# Patient Record
Sex: Male | Born: 1975 | Race: Black or African American | Hispanic: No | Marital: Married | Smoking: Never smoker
Health system: Southern US, Community
[De-identification: ages and names within clinical notes are randomized; demographics above are authoritative.]

## PROBLEM LIST (undated history)

## (undated) HISTORY — PX: FINGER SURGERY: SHX640

---

## 2017-04-09 ENCOUNTER — Emergency Department (HOSPITAL_COMMUNITY)
Admission: EM | Admit: 2017-04-09 | Discharge: 2017-04-10 | Disposition: A | Discharge status: Home / Self Care | Payer: Self-pay | Attending: Dermatology | Admitting: Dermatology

## 2017-04-09 ENCOUNTER — Encounter (HOSPITAL_COMMUNITY): Payer: Self-pay | Admitting: Emergency Medicine

## 2017-04-09 DIAGNOSIS — Z0283 Encounter for blood-alcohol and blood-drug test: Secondary | ICD-10-CM | POA: Insufficient documentation

## 2017-04-09 DIAGNOSIS — Z5321 Procedure and treatment not carried out due to patient leaving prior to being seen by health care provider: Secondary | ICD-10-CM | POA: Insufficient documentation

## 2017-04-09 NOTE — ED Notes (Signed)
Pt called for in waiting area no answer.  

## 2017-04-09 NOTE — ED Triage Notes (Signed)
Patient sent here by his employer [facility] [employer] [facility]) for drug testing . No complaints , injury or discomfort .

## 2017-08-06 ENCOUNTER — Emergency Department (HOSPITAL_COMMUNITY)
Admission: EM | Admit: 2017-08-06 | Discharge: 2017-08-06 | Disposition: A | Discharge status: Home / Self Care | Payer: Managed Care, Other (non HMO) | Attending: Emergency Medicine | Admitting: Emergency Medicine

## 2017-08-06 ENCOUNTER — Encounter (HOSPITAL_COMMUNITY): Payer: Self-pay | Admitting: Emergency Medicine

## 2017-08-06 DIAGNOSIS — L237 Allergic contact dermatitis due to plants, except food: Secondary | ICD-10-CM | POA: Insufficient documentation

## 2017-08-06 DIAGNOSIS — R21 Rash and other nonspecific skin eruption: Secondary | ICD-10-CM | POA: Diagnosis present

## 2017-08-06 MED ORDER — PREDNISONE 20 MG PO TABS
60.0000 mg | ORAL_TABLET | Freq: Once | ORAL | Status: AC
  Administered 2017-08-06: 60 mg via ORAL
  Filled 2017-08-06: qty 3

## 2017-08-06 MED ORDER — PREDNISONE 20 MG PO TABS: ORAL_TABLET | ORAL | 0 refills | Status: DC

## 2017-08-06 NOTE — ED Triage Notes (Signed)
Pt reports having rash to below right knee that started on 08/02/17. Pt reports minor relief from cortizone cream. Pt not sure if it may be poison oak due to recent yard work.

## 2017-08-06 NOTE — ED Provider Notes (Signed)
  [facility]-EMERGENCY DEPT Provider Note   CSN: [id number] Arrival date & time: 08/06/17  0037     History   Chief Complaint Chief Complaint  Patient presents with  . Rash    HPI Kenneth Stanton is a 41 y.o. male.  The history is provided by the patient.  He complains of a pruritic rash in the anterior aspect of his right leg. It is in there for 3 days and is getting worse. He has used hydrocortisone cream without any benefit. He had been working in his yard prior to the rash coming up, and he thinks he may been exposed to poison ivy.  History reviewed. No pertinent past medical history.  There are no active problems to display for this patient.   Past Surgical History:  Procedure Laterality Date  . FINGER SURGERY         Home Medications    Prior to Admission medications   Medication Sig Start Date End Date Taking? Authorizing Provider  predniSONE (DELTASONE) 20 MG tablet 2 tabs po daily x 4 days 08/06/17   Dione BoozeGlick, Valynn Schamberger, MD    Family History History reviewed. No pertinent family history.  Social History Social History  Substance Use Topics  . Smoking status: Never Smoker  . Smokeless tobacco: Never Used  . Alcohol use Yes     Allergies   Penicillins   Review of Systems Review of Systems  All other systems reviewed and are negative.    Physical Exam Updated Vital Signs BP 126/75 (BP Location: Left Arm)   Pulse 71   Temp 98.3 F (36.8 C) (Oral)   Resp 18   Ht 6\' 1"  (1.854 m)   Wt 86.2 kg (190 lb)   SpO2 94%   BMI 25.07 kg/m   Physical Exam  Nursing note and vitals reviewed.  41 year old male, resting comfortably and in no acute distress. Vital signs are normal. Oxygen saturation is 94%, which is normal. Head is normocephalic and atraumatic. PERRLA, EOMI. Oropharynx is clear. Neck is nontender and supple without adenopathy or JVD. Back is nontender and there is no CVA tenderness. Lungs are clear without rales, wheezes, or rhonchi. Chest is  nontender. Heart has regular rate and rhythm without murmur. Abdomen is soft, flat, nontender without masses or hepatosplenomegaly and peristalsis is normoactive. Extremities have no cyanosis or edema, full range of motion is present. Skin: Vesicular rash present over the anterior aspect of the right knee. Vesicles are in a linear pattern consistent with poison ivy dermatitis. Neurologic: Mental status is normal, cranial nerves are intact, there are no motor or sensory deficits.  ED Treatments / Results   Procedures Procedures (including critical care time)  Medications Ordered in ED Medications  predniSONE (DELTASONE) tablet 60 mg (not administered)     Initial Impression / Assessment and Plan / ED Course  I have reviewed the triage vital signs and the nursing notes.  Poison ivy dermatitis. He is discharged with prescription for prednisone.  Final Clinical Impressions(s) / ED Diagnoses   Final diagnoses:  Poison ivy dermatitis    New Prescriptions New Prescriptions   PREDNISONE (DELTASONE) 20 MG TABLET    2 tabs po daily x 4 days     Dione BoozeGlick, Huxley Vanwagoner, MD 08/06/17 [phone]

## 2017-08-06 NOTE — Discharge Instructions (Signed)
Take diphenhydramine (Benadryl) as needed for itching. °

## 2020-04-29 DIAGNOSIS — Z13220 Encounter for screening for lipoid disorders: Secondary | ICD-10-CM | POA: Diagnosis not present

## 2020-04-29 DIAGNOSIS — Z131 Encounter for screening for diabetes mellitus: Secondary | ICD-10-CM | POA: Diagnosis not present

## 2020-04-29 DIAGNOSIS — Z130 Encounter for screening for diseases of the blood and blood-forming organs and certain disorders involving the immune mechanism: Secondary | ICD-10-CM | POA: Diagnosis not present

## 2020-04-29 DIAGNOSIS — Z0000 Encounter for general adult medical examination without abnormal findings: Secondary | ICD-10-CM | POA: Diagnosis not present

## 2020-05-02 DIAGNOSIS — R7309 Other abnormal glucose: Secondary | ICD-10-CM | POA: Diagnosis not present

## 2020-05-06 DIAGNOSIS — E785 Hyperlipidemia, unspecified: Secondary | ICD-10-CM | POA: Diagnosis not present

## 2020-05-06 DIAGNOSIS — E1165 Type 2 diabetes mellitus with hyperglycemia: Secondary | ICD-10-CM | POA: Diagnosis not present

## 2020-05-06 DIAGNOSIS — B351 Tinea unguium: Secondary | ICD-10-CM | POA: Diagnosis not present

## 2020-05-12 DIAGNOSIS — H5203 Hypermetropia, bilateral: Secondary | ICD-10-CM | POA: Diagnosis not present

## 2020-05-12 DIAGNOSIS — H43393 Other vitreous opacities, bilateral: Secondary | ICD-10-CM | POA: Diagnosis not present

## 2020-05-12 DIAGNOSIS — E119 Type 2 diabetes mellitus without complications: Secondary | ICD-10-CM | POA: Diagnosis not present

## 2020-05-13 DIAGNOSIS — Z23 Encounter for immunization: Secondary | ICD-10-CM | POA: Diagnosis not present

## 2020-08-08 DIAGNOSIS — E785 Hyperlipidemia, unspecified: Secondary | ICD-10-CM | POA: Diagnosis not present

## 2020-08-08 DIAGNOSIS — E1165 Type 2 diabetes mellitus with hyperglycemia: Secondary | ICD-10-CM | POA: Diagnosis not present

## 2020-08-08 DIAGNOSIS — B351 Tinea unguium: Secondary | ICD-10-CM | POA: Diagnosis not present

## 2020-11-09 DIAGNOSIS — E1165 Type 2 diabetes mellitus with hyperglycemia: Secondary | ICD-10-CM | POA: Diagnosis not present

## 2021-01-07 ENCOUNTER — Emergency Department (HOSPITAL_COMMUNITY): Payer: BC Managed Care – PPO

## 2021-01-07 ENCOUNTER — Emergency Department (HOSPITAL_COMMUNITY)
Admission: EM | Admit: 2021-01-07 | Discharge: 2021-01-07 | Disposition: A | Discharge status: Home / Self Care | Payer: BC Managed Care – PPO | Attending: Emergency Medicine | Admitting: Emergency Medicine

## 2021-01-07 ENCOUNTER — Other Ambulatory Visit: Payer: Self-pay

## 2021-01-07 DIAGNOSIS — R0781 Pleurodynia: Secondary | ICD-10-CM | POA: Diagnosis not present

## 2021-01-07 DIAGNOSIS — J9811 Atelectasis: Secondary | ICD-10-CM | POA: Diagnosis not present

## 2021-01-07 DIAGNOSIS — R1012 Left upper quadrant pain: Secondary | ICD-10-CM | POA: Diagnosis not present

## 2021-01-07 DIAGNOSIS — R079 Chest pain, unspecified: Secondary | ICD-10-CM | POA: Diagnosis not present

## 2021-01-07 DIAGNOSIS — J90 Pleural effusion, not elsewhere classified: Secondary | ICD-10-CM | POA: Diagnosis not present

## 2021-01-07 DIAGNOSIS — R0789 Other chest pain: Secondary | ICD-10-CM | POA: Diagnosis not present

## 2021-01-07 DIAGNOSIS — R109 Unspecified abdominal pain: Secondary | ICD-10-CM | POA: Diagnosis not present

## 2021-01-07 LAB — CBC
HCT: 47.4 % (ref 39.0–52.0)
Hemoglobin: 14.9 g/dL (ref 13.0–17.0)
MCH: 28.5 pg (ref 26.0–34.0)
MCHC: 31.4 g/dL (ref 30.0–36.0)
MCV: 90.6 fL (ref 80.0–100.0)
Platelets: 256 10*3/uL (ref 150–400)
RBC: 5.23 MIL/uL (ref 4.22–5.81)
RDW: 12.6 % (ref 11.5–15.5)
WBC: 8.8 10*3/uL (ref 4.0–10.5)
nRBC: 0 % (ref 0.0–0.2)

## 2021-01-07 LAB — COMPREHENSIVE METABOLIC PANEL
ALT: 20 U/L (ref 0–44)
AST: 28 U/L (ref 15–41)
Albumin: 3.9 g/dL (ref 3.5–5.0)
Alkaline Phosphatase: 79 U/L (ref 38–126)
Anion gap: 11 (ref 5–15)
BUN: 17 mg/dL (ref 6–20)
CO2: 25 mmol/L (ref 22–32)
Calcium: 9.4 mg/dL (ref 8.9–10.3)
Chloride: 101 mmol/L (ref 98–111)
Creatinine, Ser: 1.05 mg/dL (ref 0.61–1.24)
GFR, Estimated: >60 mL/min (ref >60)
Glucose, Bld: 160 mg/dL — ABNORMAL HIGH (ref 70–99)
Potassium: 4 mmol/L (ref 3.5–5.1)
Sodium: 137 mmol/L (ref 135–145)
Total Bilirubin: 0.8 mg/dL (ref 0.3–1.2)
Total Protein: 7 g/dL (ref 6.5–8.1)

## 2021-01-07 LAB — TROPONIN I (HIGH SENSITIVITY): Troponin I (High Sensitivity): 3 ng/L (ref <18)

## 2021-01-07 LAB — LIPASE, BLOOD: Lipase: 19 U/L (ref 11–51)

## 2021-01-07 MED ORDER — CYCLOBENZAPRINE HCL 10 MG PO TABS: 10.0000 mg | ORAL_TABLET | Freq: Two times a day (BID) | ORAL | 0 refills | Status: DC | PRN

## 2021-01-07 MED ORDER — IOHEXOL 350 MG/ML SOLN
80.0000 mL | Freq: Once | INTRAVENOUS | Status: AC | PRN
  Administered 2021-01-07: 80 mL via INTRAVENOUS

## 2021-01-07 MED ORDER — NAPROXEN 500 MG PO TABS: 500.0000 mg | ORAL_TABLET | Freq: Two times a day (BID) | ORAL | 0 refills | Status: DC

## 2021-01-07 MED ORDER — FENTANYL CITRATE (PF) 100 MCG/2ML IJ SOLN
50.0000 ug | Freq: Once | INTRAMUSCULAR | Status: AC
  Administered 2021-01-07: 50 ug via INTRAVENOUS
  Filled 2021-01-07: qty 2

## 2021-01-07 NOTE — ED Triage Notes (Signed)
Pain in the left upper abdominal area/left rib area. Pain worse to inspiration/ palpation/movement. Onset last night around 2300.

## 2021-01-07 NOTE — ED Provider Notes (Signed)
[facility] EMERGENCY DEPARTMENT Provider Note  CSN: [id number] Arrival date & time: 01/07/21 [id number]    History Chief Complaint  Patient presents with  . Abdominal Pain    HPI  Kenneth Stanton is a 45 y.o. male with no significant PMH report sudden onset of LUQ and L lower rib pain last night around 11pm. Pain is worse with deep breath and certain movements. He denies any cough, fever, SOB or vomiting. He has not had similar before. Does not drink much EtOH. [place] did [place] [facility] [place] and back the day before yesterday. Has not had any leg swelling. No precordial chest pain.    History reviewed. No pertinent past medical history.  Past Surgical History:  Procedure Laterality Date  . FINGER SURGERY      No family history on file.  Social History   Tobacco Use  . Smoking status: Never Smoker  . Smokeless tobacco: Never Used  Substance Use Topics  . Alcohol use: Yes  . Drug use: No     Home Medications Prior to Admission medications   Medication Sig Start Date End Date Taking? Authorizing Provider  cyclobenzaprine (FLEXERIL) 10 MG tablet Take 1 tablet (10 mg total) by mouth 2 (two) times daily as needed for muscle spasms. 01/07/21  Yes Pollyann SavoySheldon, Jandy Brackens B, MD  naproxen (NAPROSYN) 500 MG tablet Take 1 tablet (500 mg total) by mouth 2 (two) times daily. 01/07/21  Yes Pollyann SavoySheldon, Celena Lanius B, MD     Allergies    Penicillins   Review of Systems   Review of Systems A comprehensive review of systems was completed and negative except as noted in HPI.    Physical Exam BP 134/71   Pulse 70   Temp 99 F (37.2 C) (Oral)   Resp 16   SpO2 96%   Physical Exam Vitals and nursing note reviewed.  Constitutional:      Appearance: Normal appearance.  HENT:     Head: Normocephalic and atraumatic.     Nose: Nose normal.     Mouth/Throat:     Mouth: Mucous membranes are moist.  Eyes:     Extraocular Movements: Extraocular movements intact.     Conjunctiva/sclera: Conjunctivae  normal.  Cardiovascular:     Rate and Rhythm: Normal rate.  Pulmonary:     Effort: Pulmonary effort is normal.     Breath sounds: Normal breath sounds.  Abdominal:     General: Abdomen is flat.     Palpations: Abdomen is soft.     Tenderness: There is abdominal tenderness in the left upper quadrant. There is no guarding. Negative signs include Murphy's sign and McBurney's sign.  Musculoskeletal:        General: No swelling. Normal range of motion.     Cervical back: Neck supple.  Skin:    General: Skin is warm and dry.  Neurological:     General: No focal deficit present.     Mental Status: He is alert.  Psychiatric:        Mood and Affect: Mood normal.      ED Results / Procedures / Treatments   Labs (all labs ordered are listed, but only abnormal results are displayed) Labs Reviewed  COMPREHENSIVE METABOLIC PANEL - Abnormal; Notable for the following components:      Result Value   Glucose, Bld 160 (*)    All other components within normal limits  CBC  LIPASE, BLOOD  TROPONIN I (HIGH SENSITIVITY)    EKG EKG Interpretation  Date/Time:  Saturday January 07 2021 06:19:01 EST Ventricular Rate:  92 PR Interval:  174 QRS Duration: 80 QT Interval:  320 QTC Calculation: 395 R Axis:   70 Text Interpretation: Normal sinus rhythm Cannot rule out Anterior infarct , age undetermined Abnormal ECG No old tracing to compare Confirmed by Susy Frizzle [phone]) on 01/07/2021 11:27:38 AM   Radiology DG Chest 2 View  Result Date: 01/07/2021 CLINICAL DATA:  45 year old male with left upper abdomen and rib pain on set 2300 hours. Symptoms increased with movement. EXAM: CHEST - 2 VIEW COMPARISON:  None. FINDINGS: Low normal lung volumes. Mediastinal contours are within normal limits, however, there is abnormal increased left paraspinal density at T9-T10. Those levels appear intact radiographically with chronic endplate spurring. No left rib fracture or lesion is evident. Mildly  increased interstitial markings in the bilateral lungs, more pronounced in the upper lobes. No pneumothorax, pleural effusion or confluent pulmonary opacity. Negative visible bowel gas pattern. IMPRESSION: 1. Increased but nonspecific left paraspinal density at T9-T10. Underlying vertebrae appear intact with degenerative endplate spurring. Considering rib and abdominal pain complaints, Chest CT (or Chest, Abdomen CT) follow-up may be more appropriate than Thoracic Spine MRI at this time. IV contrast preferred on follow-up imaging. 2. Nonspecific mildly increased interstitial markings, most pronounced in the upper lungs. Query smoking history. Viral/atypical respiratory infection not excluded. Electronically Signed   By: Odessa Fleming M.D.   On: 01/07/2021 06:53   CT Angio Chest PE W/Cm &/Or Wo Cm  Result Date: 01/07/2021 CLINICAL DATA:  Left rib pain and upper abdominal pain. EXAM: CT ANGIOGRAPHY CHEST CT ABDOMEN AND PELVIS WITH CONTRAST TECHNIQUE: Multidetector CT imaging of the chest was performed using the standard protocol during bolus administration of intravenous contrast. Multiplanar CT image reconstructions and MIPs were obtained to evaluate the vascular anatomy. Multidetector CT imaging of the abdomen and pelvis was performed using the standard protocol during bolus administration of intravenous contrast. CONTRAST:  54mL OMNIPAQUE IOHEXOL 350 MG/ML SOLN COMPARISON:  None. FINDINGS: CTA CHEST FINDINGS Cardiovascular: Satisfactory opacification of the pulmonary arteries to the segmental level. No evidence of pulmonary embolism. Normal heart size. No pericardial effusion. Mediastinum/Nodes: No enlarged mediastinal, hilar, or axillary lymph nodes. Thyroid gland, trachea, and esophagus demonstrate no significant findings. Lungs/Pleura: No pneumothorax is noted. Minimal left pleural effusion is noted with adjacent subsegmental atelectasis. Minimal right posterior basilar subsegmental atelectasis is noted.  Musculoskeletal: No chest wall abnormality. No acute or significant osseous findings. Review of the MIP images confirms the above findings. CT ABDOMEN and PELVIS FINDINGS Hepatobiliary: No focal liver abnormality is seen. No gallstones, gallbladder wall thickening, or biliary dilatation. Pancreas: Unremarkable. No pancreatic ductal dilatation or surrounding inflammatory changes. Spleen: Normal in size without focal abnormality. Adrenals/Urinary Tract: Adrenal glands are unremarkable. Kidneys are normal, without renal calculi, focal lesion, or hydronephrosis. Bladder is unremarkable. Stomach/Bowel: Stomach is within normal limits. Appendix appears normal. No evidence of bowel wall thickening, distention, or inflammatory changes. Vascular/Lymphatic: No significant vascular findings are present. No enlarged abdominal or pelvic lymph nodes. Reproductive: Prostate is unremarkable. Other: No abdominal wall hernia or abnormality. No abdominopelvic ascites. Musculoskeletal: No acute or significant osseous findings. Review of the MIP images confirms the above findings. IMPRESSION: 1. No definite evidence of pulmonary embolus. 2. Minimal left pleural effusion is noted with adjacent subsegmental atelectasis. 3. Minimal right posterior basilar subsegmental atelectasis is noted. 4. No other abnormality seen in the chest, abdomen or pelvis. Electronically Signed   By: Zenda Alpers.D.  On: 01/07/2021 12:50   CT Abdomen Pelvis W Contrast  Result Date: 01/07/2021 CLINICAL DATA:  Left rib pain and upper abdominal pain. EXAM: CT ANGIOGRAPHY CHEST CT ABDOMEN AND PELVIS WITH CONTRAST TECHNIQUE: Multidetector CT imaging of the chest was performed using the standard protocol during bolus administration of intravenous contrast. Multiplanar CT image reconstructions and MIPs were obtained to evaluate the vascular anatomy. Multidetector CT imaging of the abdomen and pelvis was performed using the standard protocol during bolus  administration of intravenous contrast. CONTRAST:  31mL OMNIPAQUE IOHEXOL 350 MG/ML SOLN COMPARISON:  None. FINDINGS: CTA CHEST FINDINGS Cardiovascular: Satisfactory opacification of the pulmonary arteries to the segmental level. No evidence of pulmonary embolism. Normal heart size. No pericardial effusion. Mediastinum/Nodes: No enlarged mediastinal, hilar, or axillary lymph nodes. Thyroid gland, trachea, and esophagus demonstrate no significant findings. Lungs/Pleura: No pneumothorax is noted. Minimal left pleural effusion is noted with adjacent subsegmental atelectasis. Minimal right posterior basilar subsegmental atelectasis is noted. Musculoskeletal: No chest wall abnormality. No acute or significant osseous findings. Review of the MIP images confirms the above findings. CT ABDOMEN and PELVIS FINDINGS Hepatobiliary: No focal liver abnormality is seen. No gallstones, gallbladder wall thickening, or biliary dilatation. Pancreas: Unremarkable. No pancreatic ductal dilatation or surrounding inflammatory changes. Spleen: Normal in size without focal abnormality. Adrenals/Urinary Tract: Adrenal glands are unremarkable. Kidneys are normal, without renal calculi, focal lesion, or hydronephrosis. Bladder is unremarkable. Stomach/Bowel: Stomach is within normal limits. Appendix appears normal. No evidence of bowel wall thickening, distention, or inflammatory changes. Vascular/Lymphatic: No significant vascular findings are present. No enlarged abdominal or pelvic lymph nodes. Reproductive: Prostate is unremarkable. Other: No abdominal wall hernia or abnormality. No abdominopelvic ascites. Musculoskeletal: No acute or significant osseous findings. Review of the MIP images confirms the above findings. IMPRESSION: 1. No definite evidence of pulmonary embolus. 2. Minimal left pleural effusion is noted with adjacent subsegmental atelectasis. 3. Minimal right posterior basilar subsegmental atelectasis is noted. 4. No other  abnormality seen in the chest, abdomen or pelvis. Electronically Signed   By: Lupita Raider M.D.   On: 01/07/2021 12:50    Procedures Procedures  Medications Ordered in the ED Medications  fentaNYL (SUBLIMAZE) injection 50 mcg (50 mcg Intravenous Given 01/07/21 1245)  iohexol (OMNIPAQUE) 350 MG/ML injection 80 mL (80 mLs Intravenous Contrast Given 01/07/21 1224)     MDM Rules/Calculators/A&P MDM Labs ordered in triage reviewed and neg. CXR with an unusual finding, recommend CT chest/abdomen/pelvis. Given recent travel will do CTA chest. Patient is otherwise non-toxic appearing in no distress with normal vitals.  ED Course  I have reviewed the triage vital signs and the nursing notes.  Pertinent labs & imaging results that were available during my care of the patient were reviewed by me and considered in my medical decision making (see chart for details).  Clinical Course as of 01/07/21 1347  Sat Jan 07, 2021  1314 CT images and results reviewed, no concerning findings. Lipase and Trop are normal.  [CS]  1341 No clear etiology for the patient's pain but he is feeling better and labs/imaging are all normal. Recommend NSAIDs, rest and follow up with PCP if pain persists. RTED for any other concerns or worsening. [CS]    Clinical Course User Index [CS] Pollyann Savoy, MD    Final Clinical Impression(s) / ED Diagnoses Final diagnoses:  LUQ abdominal pain    Rx / DC Orders ED Discharge Orders         Ordered    naproxen (  NAPROSYN) 500 MG tablet  2 times daily        01/07/21 1346    cyclobenzaprine (FLEXERIL) 10 MG tablet  2 times daily PRN        01/07/21 1346           Pollyann Savoy, MD 01/07/21 1347

## 2021-01-07 NOTE — ED Notes (Signed)
Patient verbalizes understanding of discharge instructions. Opportunity for questioning and answers were provided. Arm band removed by staff, patient discharged from ED. 

## 2021-07-22 IMAGING — CT CT ABD-PELV W/ CM
2 of 5 series · 15 of 46 positions shown, 17 images · IV contrast (omnipaque)
Comparison: None.

CLINICAL DATA: Left rib pain and upper abdominal pain.

EXAM:
CT ANGIOGRAPHY CHEST
CT ABDOMEN AND PELVIS WITH CONTRAST
TECHNIQUE: Multidetector CT imaging of the chest was performed using the
standard protocol during bolus administration of intravenous
contrast. Multiplanar CT image reconstructions and MIPs were
obtained to evaluate the vascular anatomy. Multidetector CT imaging
of the abdomen and pelvis was performed using the standard protocol
during bolus administration of intravenous contrast.
CONTRAST:  80mL OMNIPAQUE IOHEXOL 350 MG/ML SOLN

[Series 4: a/p w/ 5mm · axial · 0.71mm/px · z∈[-702,-252]mm · 12 of 102 slices shown, 14 images]
[im 6/102  soft-tissue]
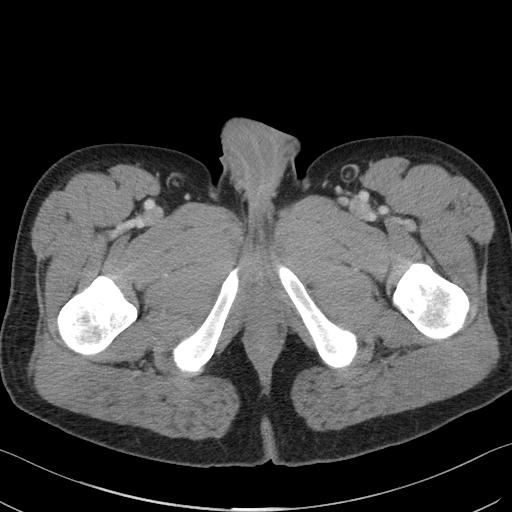
[im 6/102  bone]
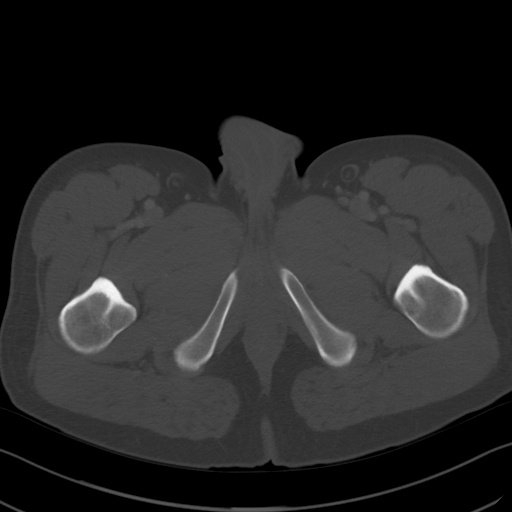
[im 16/102  soft-tissue]
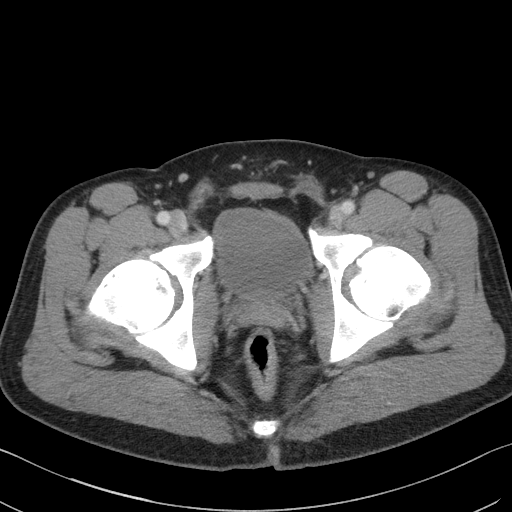
[im 22/102  soft-tissue]
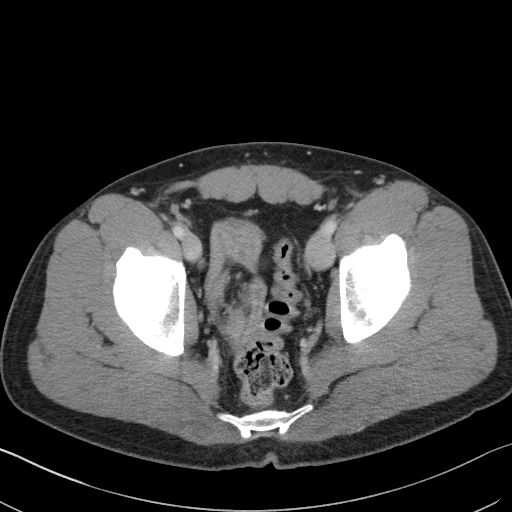
[im 32/102  soft-tissue]
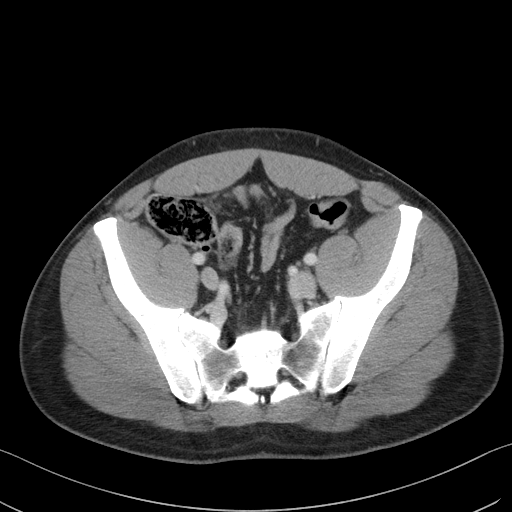
[im 38/102  soft-tissue]
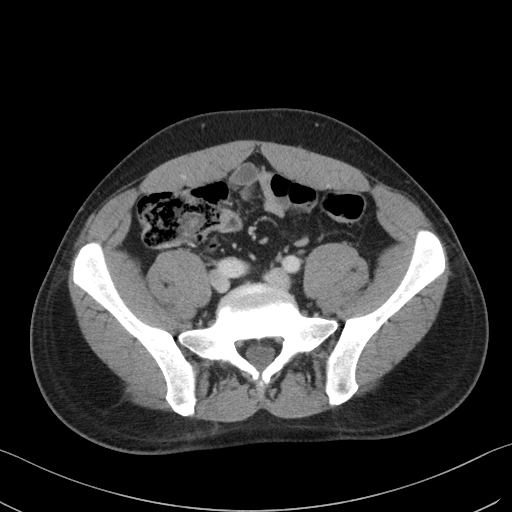
[im 48/102  soft-tissue]
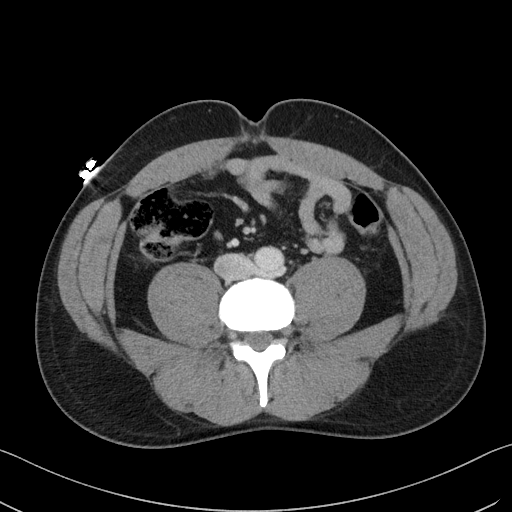
[im 54/102  soft-tissue]
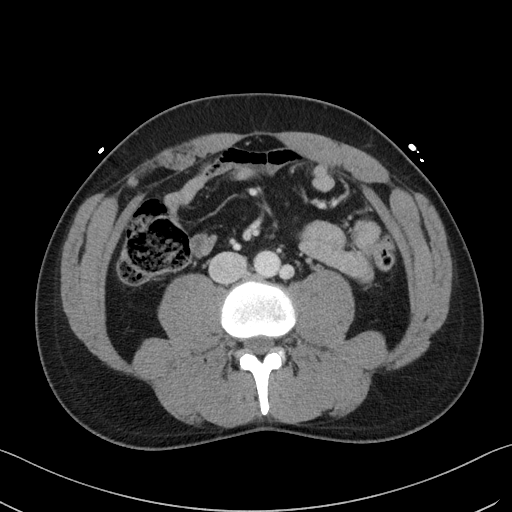
[im 64/102  soft-tissue]
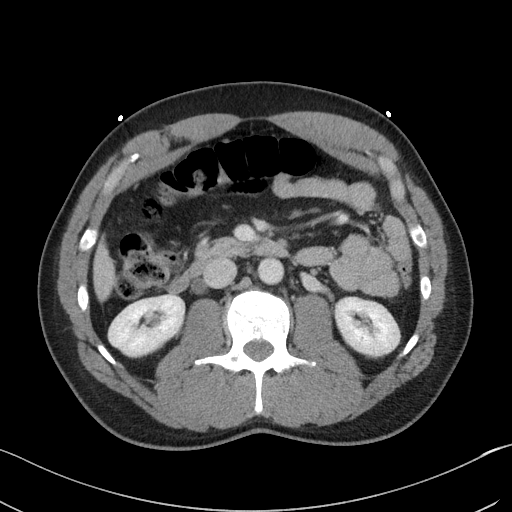
[im 70/102  soft-tissue]
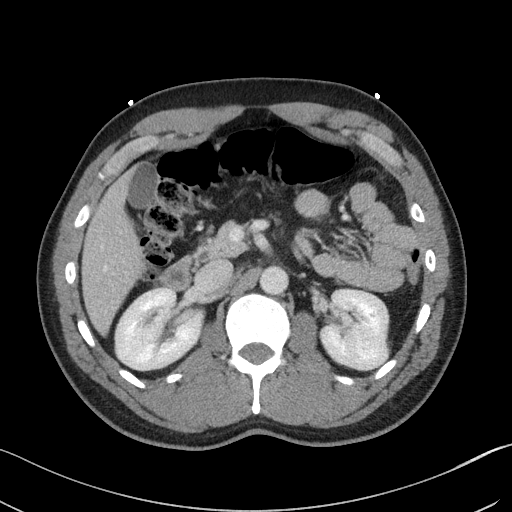
[im 70/102  bone]
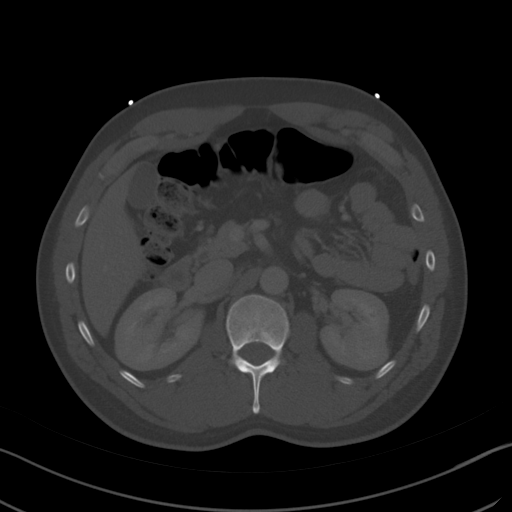
[im 80/102  soft-tissue]
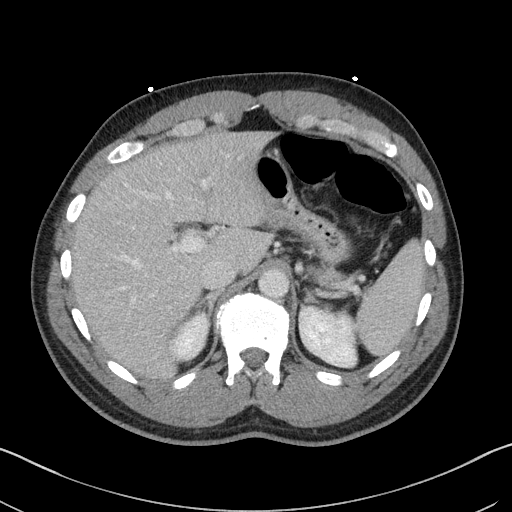
[im 86/102  soft-tissue]
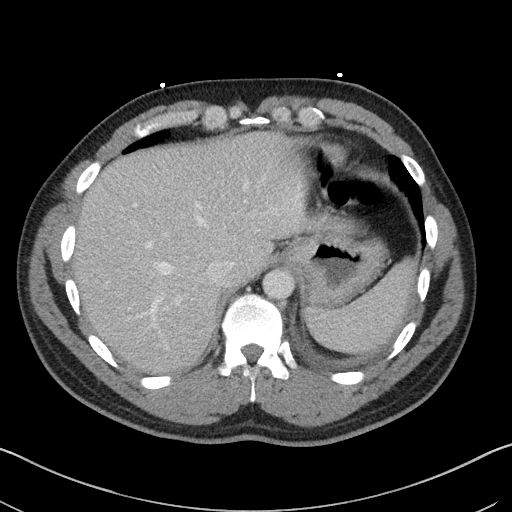
[im 96/102  soft-tissue]
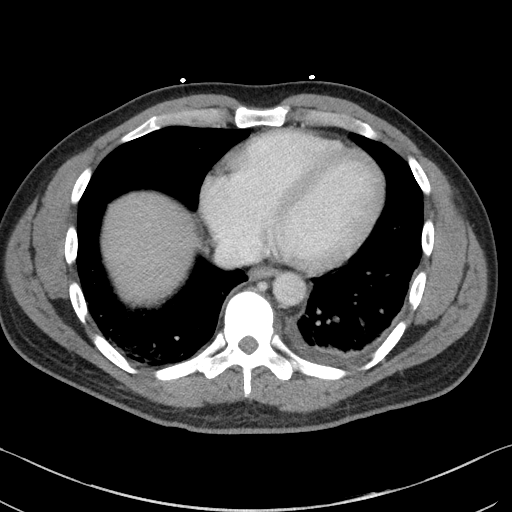

[Series 7: a/p w/ cor · coronal · 0.71mm/px · 3 of 150 slices shown]
[im 50/150  soft-tissue]
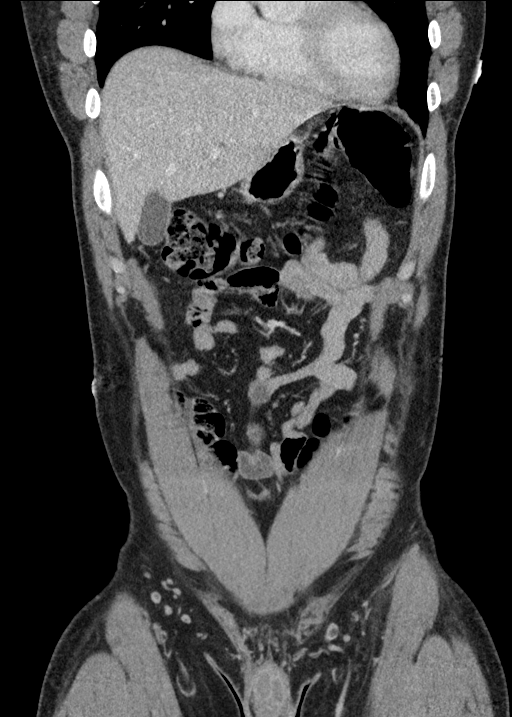
[im 67/150  soft-tissue]
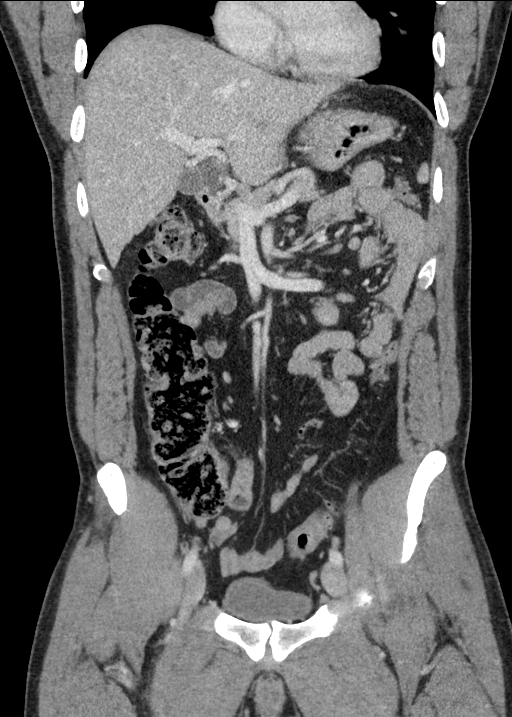
[im 83/150  soft-tissue]
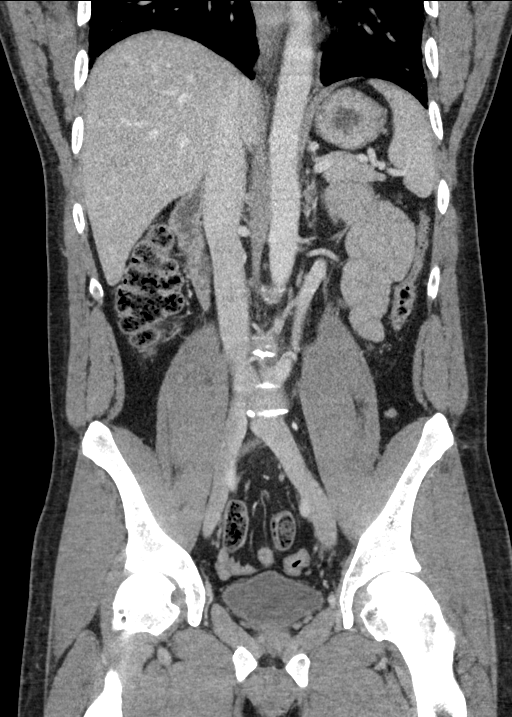

[15 of 46 positions shown; findings below may reference images not displayed]

FINDINGS: CTA CHEST FINDINGS

Cardiovascular: Satisfactory opacification of the pulmonary arteries
to the segmental level. No evidence of pulmonary embolism. Normal
heart size. No pericardial effusion.

Mediastinum/Nodes: No enlarged mediastinal, hilar, or axillary lymph
nodes. Thyroid gland, trachea, and esophagus demonstrate no
significant findings.

Lungs/Pleura: No pneumothorax is noted. Minimal left pleural
effusion is noted with adjacent subsegmental atelectasis. Minimal
right posterior basilar subsegmental atelectasis is noted.

Musculoskeletal: No chest wall abnormality. No acute or significant
osseous findings.

Review of the MIP images confirms the above findings.

CT ABDOMEN and PELVIS FINDINGS

Hepatobiliary: No focal liver abnormality is seen. No gallstones,
gallbladder wall thickening, or biliary dilatation.

Pancreas: Unremarkable. No pancreatic ductal dilatation or
surrounding inflammatory changes.

Spleen: Normal in size without focal abnormality.

Adrenals/Urinary Tract: Adrenal glands are unremarkable. Kidneys are
normal, without renal calculi, focal lesion, or hydronephrosis.
Bladder is unremarkable.

Stomach/Bowel: Stomach is within normal limits. Appendix appears
normal. No evidence of bowel wall thickening, distention, or
inflammatory changes.

Vascular/Lymphatic: No significant vascular findings are present. No
enlarged abdominal or pelvic lymph nodes.

Reproductive: Prostate is unremarkable.

Other: No abdominal wall hernia or abnormality. No abdominopelvic
ascites.

Musculoskeletal: No acute or significant osseous findings.

Review of the MIP images confirms the above findings.
IMPRESSION: 1. No definite evidence of pulmonary embolus.
2. Minimal left pleural effusion is noted with adjacent subsegmental
atelectasis.
3. Minimal right posterior basilar subsegmental atelectasis is
noted.
4. No other abnormality seen in the chest, abdomen or pelvis.

## 2021-07-22 IMAGING — CR DG CHEST 2V
2 series · 2 of 2 positions shown · non-contrast
Comparison: None.

CLINICAL DATA: 45-year-old male with left upper abdomen and rib
pain on August 2299 hours. Symptoms increased with movement.

EXAM:
CHEST - 2 VIEW

[chest pa]
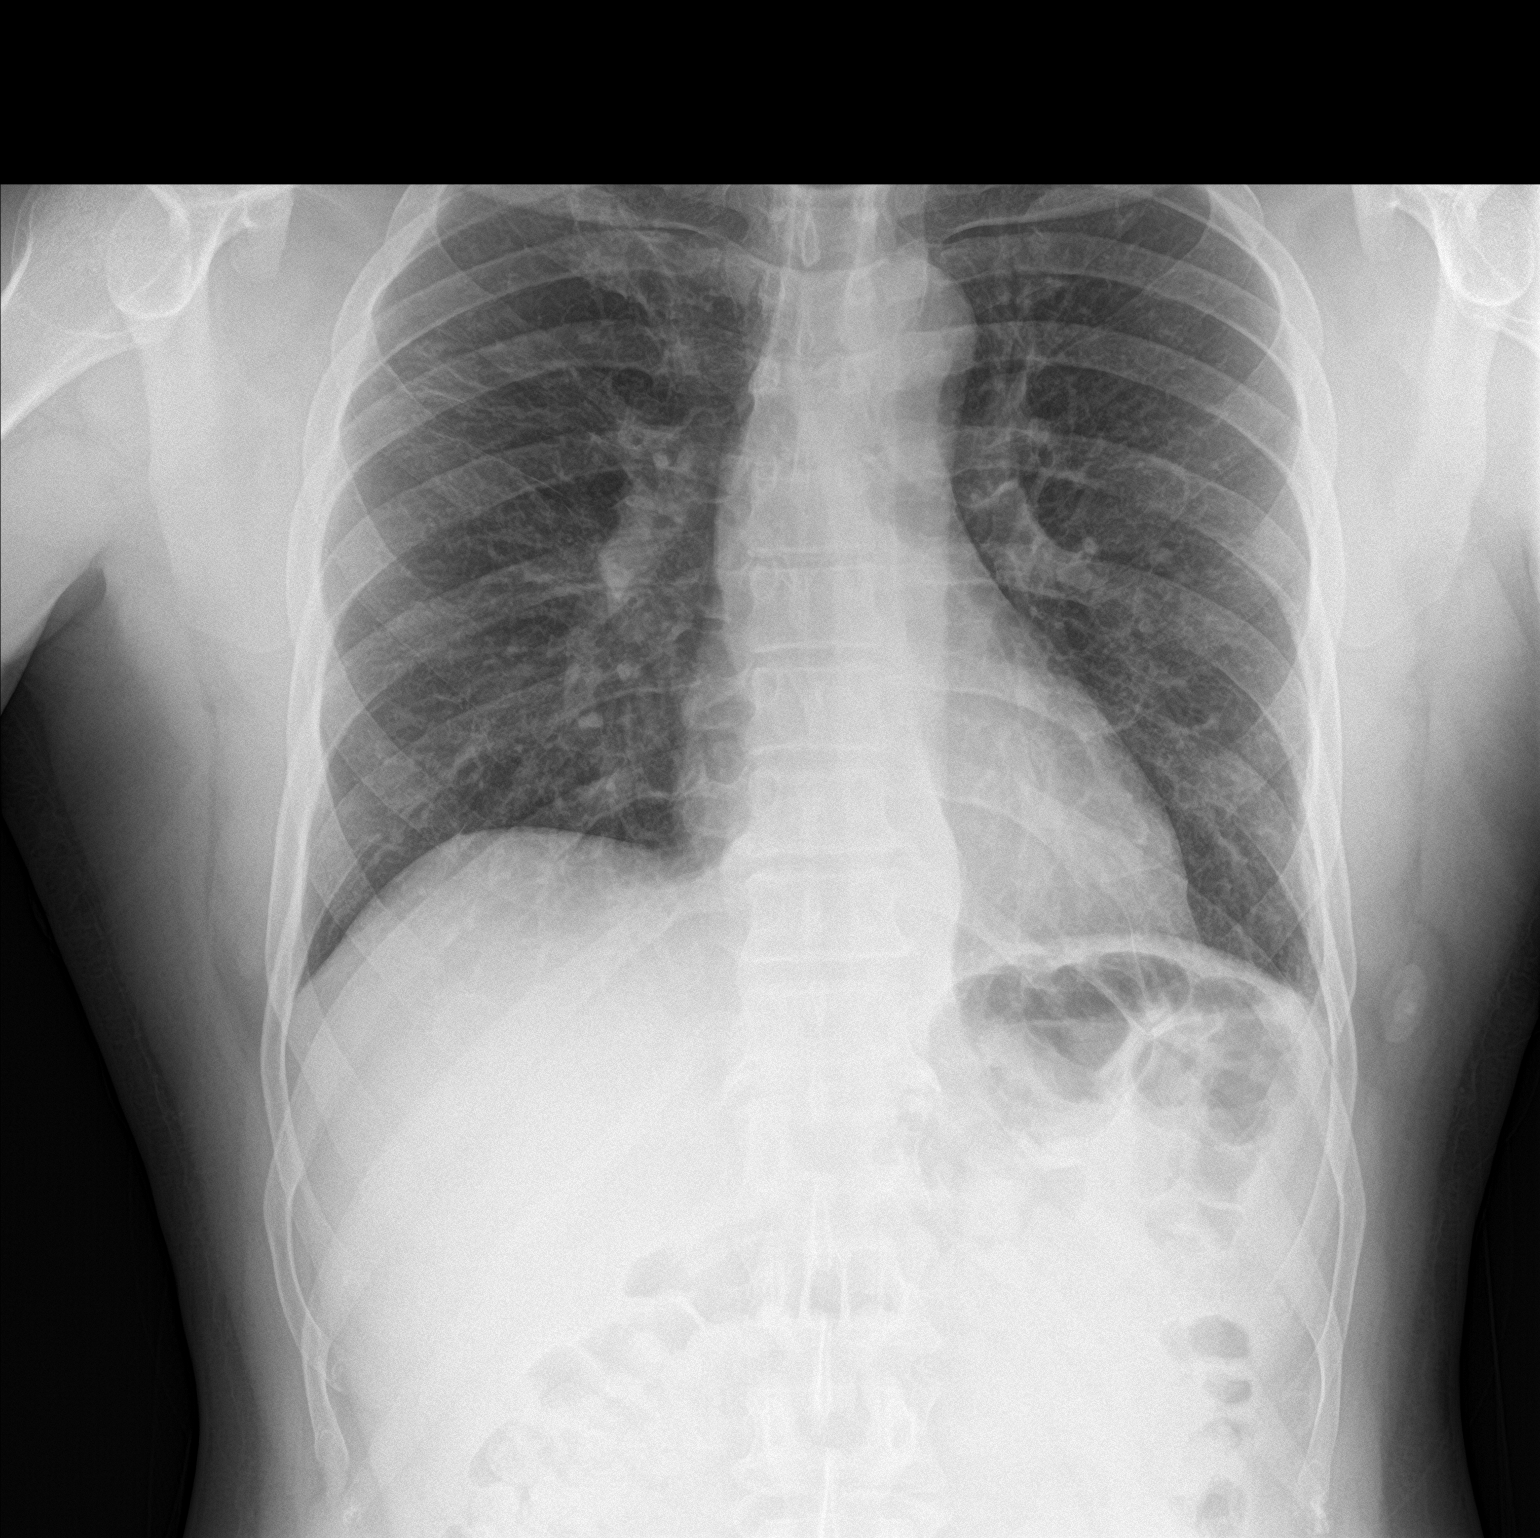

[chest lat]
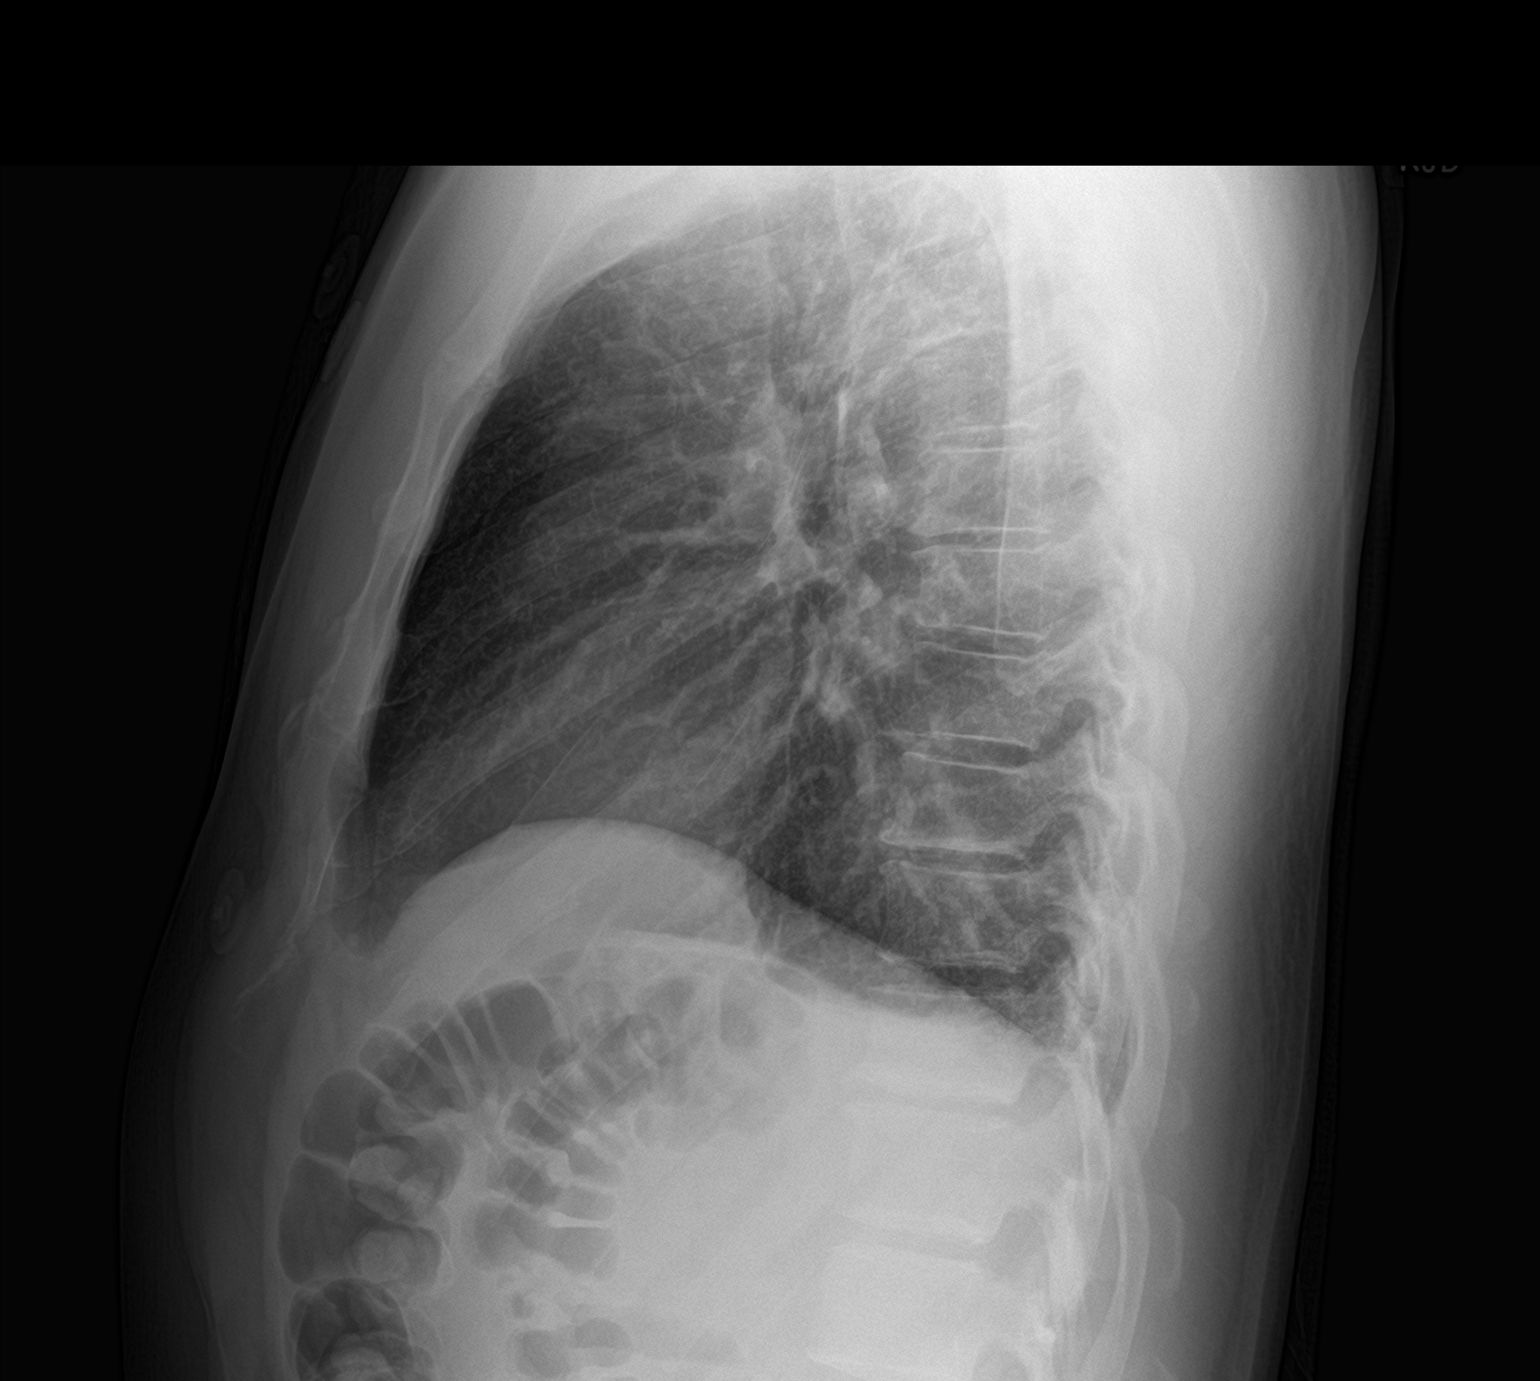

[2 of 2 positions shown; findings below may reference images not displayed]

FINDINGS: Low normal lung volumes. Mediastinal contours are within normal
limits, however, there is abnormal increased left paraspinal density
at T9-T10. Those levels appear intact radiographically with chronic
endplate spurring. No left rib fracture or lesion is evident.

Mildly increased interstitial markings in the bilateral lungs, more
pronounced in the upper lobes. No pneumothorax, pleural effusion or
confluent pulmonary opacity.

Negative visible bowel gas pattern.
IMPRESSION: 1. Increased but nonspecific left paraspinal density at T9-T10.
Underlying vertebrae appear intact with degenerative endplate
spurring.
Considering rib and abdominal pain complaints, Chest CT (or Chest,
Abdomen CT) follow-up may be more appropriate than Thoracic Spine
MRI at this time. IV contrast preferred on follow-up imaging.

2. Nonspecific mildly increased interstitial markings, most
pronounced in the upper lungs. Query smoking history. Viral/atypical
respiratory infection not excluded.

## 2022-01-11 ENCOUNTER — Emergency Department (HOSPITAL_COMMUNITY)
Admission: EM | Admit: 2022-01-11 | Discharge: 2022-01-11 | Disposition: A | Discharge status: Home / Self Care | Payer: BC Managed Care – PPO | Attending: Emergency Medicine | Admitting: Emergency Medicine

## 2022-01-11 ENCOUNTER — Encounter (HOSPITAL_COMMUNITY): Payer: Self-pay | Admitting: Emergency Medicine

## 2022-01-11 ENCOUNTER — Other Ambulatory Visit: Payer: Self-pay

## 2022-01-11 DIAGNOSIS — B9789 Other viral agents as the cause of diseases classified elsewhere: Secondary | ICD-10-CM | POA: Diagnosis not present

## 2022-01-11 DIAGNOSIS — J069 Acute upper respiratory infection, unspecified: Secondary | ICD-10-CM | POA: Diagnosis not present

## 2022-01-11 DIAGNOSIS — Z20822 Contact with and (suspected) exposure to covid-19: Secondary | ICD-10-CM | POA: Insufficient documentation

## 2022-01-11 DIAGNOSIS — R0981 Nasal congestion: Secondary | ICD-10-CM | POA: Diagnosis not present

## 2022-01-11 LAB — RESP PANEL BY RT-PCR (FLU A&B, COVID) ARPGX2
Influenza A by PCR: NEGATIVE
Influenza B by PCR: NEGATIVE
SARS Coronavirus 2 by RT PCR: NEGATIVE

## 2022-01-11 MED ORDER — ACETAMINOPHEN 325 MG PO TABS
650.0000 mg | ORAL_TABLET | Freq: Once | ORAL | Status: AC
  Administered 2022-01-11: 650 mg via ORAL
  Filled 2022-01-11: qty 2

## 2022-01-11 MED ORDER — BENZONATATE 100 MG PO CAPS: 100.0000 mg | ORAL_CAPSULE | Freq: Three times a day (TID) | ORAL | 0 refills | Status: DC

## 2022-01-11 MED ORDER — IBUPROFEN 800 MG PO TABS
800.0000 mg | ORAL_TABLET | Freq: Once | ORAL | Status: AC
  Administered 2022-01-11: 800 mg via ORAL
  Filled 2022-01-11: qty 1

## 2022-01-11 MED ORDER — BENZONATATE 100 MG PO CAPS
100.0000 mg | ORAL_CAPSULE | Freq: Once | ORAL | Status: AC
  Administered 2022-01-11: 100 mg via ORAL
  Filled 2022-01-11: qty 1

## 2022-01-11 NOTE — ED Provider Notes (Signed)
[name] Provider Note   CSN: [id number] Arrival date & time: 01/11/22  0759     History  Chief Complaint  Patient presents with   URI    Kenneth Stanton is a 46 y.o. male.  With no pertinent past medical history presents emergency department with viral upper respiratory symptoms.  Patient states that beginning last night he started having viral upper respiratory symptoms including congestion, rhinorrhea, sneezing, cough.  He states that his grandson was sick earlier this week and perhaps was exposed to him or somebody at work.  He denies any shortness of breath, chest pain, phlegm production, fevers, myalgias, headache, nausea or vomiting.  He is tolerating p.o. with good appetite.  HPI     Home Medications Prior to Admission medications   Medication Sig Start Date End Date Taking? Authorizing Provider  cyclobenzaprine (FLEXERIL) 10 MG tablet Take 1 tablet (10 mg total) by mouth 2 (two) times daily as needed for muscle spasms. 01/07/21   Truddie Hidden, MD  naproxen (NAPROSYN) 500 MG tablet Take 1 tablet (500 mg total) by mouth 2 (two) times daily. 01/07/21   Truddie Hidden, MD      Allergies    Penicillins    Review of Systems   Review of Systems  Constitutional:  Positive for fatigue. Negative for appetite change and fever.  HENT:  Positive for congestion, rhinorrhea and sneezing.   Respiratory:  Positive for cough. Negative for shortness of breath.   Cardiovascular:  Negative for chest pain.  Gastrointestinal:  Negative for nausea and vomiting.  Musculoskeletal:  Negative for myalgias.  Neurological:  Negative for headaches.  All other systems reviewed and are negative.  Physical Exam Updated Vital Signs BP 124/82 (BP Location: Right Arm)    Pulse 83    Temp 98.1 F (36.7 C) (Oral)    Resp 18    Ht 6\' 1"  (1.854 m)    Wt 86 kg    SpO2 95%    BMI 25.01 kg/m  Physical Exam Vitals and nursing note reviewed.  Constitutional:       General: He is not in acute distress.    Appearance: Normal appearance. He is normal weight. He is ill-appearing. He is not toxic-appearing.  HENT:     Head: Normocephalic and atraumatic.     Nose: Congestion present.     Mouth/Throat:     Mouth: Mucous membranes are moist.     Pharynx: Oropharynx is clear. Posterior oropharyngeal erythema present.  Eyes:     General: No scleral icterus.    Extraocular Movements: Extraocular movements intact.     Pupils: Pupils are equal, round, and reactive to light.  Cardiovascular:     Rate and Rhythm: Normal rate and regular rhythm.     Pulses: Normal pulses.     Heart sounds: Normal heart sounds. No murmur heard. Pulmonary:     Effort: Pulmonary effort is normal. No respiratory distress.     Breath sounds: Normal breath sounds.  Abdominal:     Palpations: Abdomen is soft.  Musculoskeletal:        General: Normal range of motion.     Cervical back: Normal range of motion. No tenderness.  Skin:    General: Skin is warm and dry.     Capillary Refill: Capillary refill takes less than 2 seconds.     Findings: No rash.  Neurological:     General: No focal deficit present.     Mental Status:  He is alert and oriented to person, place, and time. Mental status is at baseline.  Psychiatric:        Mood and Affect: Mood normal.        Behavior: Behavior normal.        Thought Content: Thought content normal.        Judgment: Judgment normal.    ED Results / Procedures / Treatments   Labs (all labs ordered are listed, but only abnormal results are displayed) Labs Reviewed  RESP PANEL BY RT-PCR (FLU A&B, COVID) [id number]    EKG None  Radiology No results found.  Procedures Procedures   Medications Ordered in ED Medications  ibuprofen (ADVIL) tablet 800 mg (800 mg Oral Given 01/11/22 0837)  benzonatate (TESSALON) capsule 100 mg (100 mg Oral Given 01/11/22 0838)  acetaminophen (TYLENOL) tablet 650 mg (650 mg Oral Given 01/11/22 [id number])     ED Course/ Medical Decision Making/ A&P                           Medical Decision Making Risk OTC drugs. Prescription drug management.  Patient presents to the ED with complaints of viral upper respiratory symptoms. This involves an extensive number of treatment options, and is a complaint that carries with it a low risk of complications and morbidity.   Additional history obtained:  Additional history obtained from: Significant other External records from outside source obtained and reviewed including: Previous primary care visits  Lab Results: I Ordered, reviewed, and interpreted labs. Pertinent results include: COVID and flu PCR negative  Medications  I ordered medication including Tylenol, ibuprofen, Tessalon for cough Reevaluation of the patient after medication shows that patient improved  Tests Considered: Chest x-ray  ED Course: 46 year old male who presents emergency department with viral upper respiratory symptoms beginning last night.  Nontoxic in appearance, well-appearing.  He does have some congestion.  His lung sounds were clear so did not obtain imaging as he did not think he has pneumonia.  No fevers reported.  Given symptomatic medication including Tylenol, ibuprofen and Tessalon.  His COVID and flu were negative here.  Discussed that he could still have other viral respiratory infection that is not COVID and flu.  He verbalized understanding.  Answered wife and patient questions at bedside.  Problem List:  Viral upper respiratory symptoms Patient presents with symptoms suspicious for viral upper respiratory infection.  Based on history and physical doubt sinusitis.  COVID and flu negative. Not suspect cardiopulmonary process as a component of this. Considered but think unlikely dangerous causes of patient's symptoms including ACS, CHF, COPD exacerbation, pneumonia or pneumothorax. Patient nontoxic in appearance.  No need of emergent intervention at this  time.  Dispostion:  After consideration of the diagnostic results and the patients response to treatment, I feel that the patent would benefit from discharge.  Patient given prescription for Tessalon.  Discussed that he can use over-the-counter cough and cold medications as well as Tylenol and Motrin for symptomatic relief of symptoms.  Discussed return to the emergency department for shortness of breath or respiratory distress, chest pain.  He verbalized understanding.  Vital signs are stable.  Safe for discharge.   Final Clinical Impression(s) / ED Diagnoses Final diagnoses:  Viral URI with cough    Rx / DC Orders ED Discharge Orders          Ordered    benzonatate (TESSALON) 100 MG capsule  Every 8 hours  01/11/22 0925              Mickie Hillier, PA-C 01/11/22 [id number]    Margette Fast, MD 01/17/22 2009

## 2022-01-11 NOTE — ED Notes (Signed)
I provided a copy of the pt's Covid & Flu result per pt request.

## 2022-01-11 NOTE — Discharge Instructions (Addendum)
You were seen in the emergency department for symptoms consistent with a viral upper respiratory infection. You tested negative for COVID and Flu today. Please use OTC medications including tylenol, ibuprofen and cough/cold medication to treat your symptoms at home. I am prescribing you cough  medications that you can take to decrease symptoms. Return to the ED for shortness of breath, difficulty breathing or chest pain.

## 2022-01-11 NOTE — ED Notes (Signed)
I provided reinforced discharge education based off of discharge instructions. Pt acknowledged and understood my education. Pt had no further questions/concerns for provider/myself.  °

## 2022-01-11 NOTE — ED Triage Notes (Signed)
Patient complains of congestion, nasal drainage and feeling unwell since last night. Did not check temperature at home. Has not taken any medication. Is not vaccinated against the flu.

## 2022-04-20 DIAGNOSIS — E1165 Type 2 diabetes mellitus with hyperglycemia: Secondary | ICD-10-CM | POA: Diagnosis not present

## 2022-04-20 DIAGNOSIS — E785 Hyperlipidemia, unspecified: Secondary | ICD-10-CM | POA: Diagnosis not present

## 2022-04-20 DIAGNOSIS — Z0000 Encounter for general adult medical examination without abnormal findings: Secondary | ICD-10-CM | POA: Diagnosis not present

## 2022-04-20 DIAGNOSIS — R5383 Other fatigue: Secondary | ICD-10-CM | POA: Diagnosis not present

## 2022-04-20 DIAGNOSIS — Z1211 Encounter for screening for malignant neoplasm of colon: Secondary | ICD-10-CM | POA: Diagnosis not present

## 2023-01-14 ENCOUNTER — Telehealth: Payer: Self-pay

## 2023-01-14 NOTE — Patient Outreach (Signed)
  Care Coordination   Initial Visit Note   01/14/2023 Name: Kenneth Stanton MRN: [id number] DOB: April 07, 1976  Kenneth Stanton is a 47 y.o. year old male who sees Patient, No Pcp Per for primary care. I spoke with  Kenneth Stanton by phone today.  What matters to the patients health and wellness today? 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 Patient to call [facility] to schedule appointment. Patient given number to [facility]   Goals Addressed             This Visit's Progress    COMPLETED: Care Coordination Activities-No follow up required       Care Coordination Interventions: Advised patient to Annual Wellness exam. Discussed [facility] services and support. Assessed SDOH. Advised to discuss with primary care physician if services needed in the future.        SDOH assessments and interventions completed:  Yes  SDOH Interventions Today    Flowsheet Row Most Recent Value  SDOH Interventions   Transportation Interventions Intervention Not Indicated        Care Coordination Interventions:  Yes, provided   Follow up plan: No further intervention required.   Encounter Outcome:  Pt. Visit Completed   Kenneth Baseman, RN, MSN [facility] Care Management Coordinator Direct Line [phone]

## 2023-01-14 NOTE — Patient Instructions (Signed)
Visit Information  Thank you for taking time to visit with me today. Please don't hesitate to contact me if I can be of assistance to you.   Following are the goals we discussed today:   Goals Addressed             This Visit's Progress    COMPLETED: Care Coordination Activities-No follow up required       Care Coordination Interventions: Advised patient to Annual Wellness exam. Discussed [facility] services and support. Assessed SDOH. Advised to discuss with primary care physician if services needed in the future.         If you are experiencing a Mental Health or Behavioral Health Crisis or need someone to talk to, please call the Suicide and Crisis Lifeline: 988   Patient verbalizes understanding of instructions and care plan provided today and agrees to view in MyChart. Active MyChart status and patient understanding of how to access instructions and care plan via MyChart confirmed with patient.     No further follow up required: decline  Bowie Delia J Naven Giambalvo, RN, MSN [facility] Care Management Care Management Coordinator Direct Line [phone]     

## 2025-01-01 ENCOUNTER — Encounter: Payer: Self-pay | Admitting: Family Medicine

## 2025-01-01 ENCOUNTER — Ambulatory Visit: Payer: Self-pay | Admitting: Family Medicine

## 2025-01-01 ENCOUNTER — Other Ambulatory Visit: Payer: Self-pay | Admitting: Family Medicine

## 2025-01-01 VITALS — BP 134/77 | HR 83 | Ht 73.0 in | Wt 190.6 lb

## 2025-01-01 DIAGNOSIS — Z7984 Long term (current) use of oral hypoglycemic drugs: Secondary | ICD-10-CM

## 2025-01-01 DIAGNOSIS — E785 Hyperlipidemia, unspecified: Secondary | ICD-10-CM

## 2025-01-01 DIAGNOSIS — Z7689 Persons encountering health services in other specified circumstances: Secondary | ICD-10-CM

## 2025-01-01 DIAGNOSIS — Z794 Long term (current) use of insulin: Secondary | ICD-10-CM

## 2025-01-01 DIAGNOSIS — N481 Balanitis: Secondary | ICD-10-CM

## 2025-01-01 DIAGNOSIS — E11649 Type 2 diabetes mellitus with hypoglycemia without coma: Secondary | ICD-10-CM

## 2025-01-01 LAB — POCT GLYCOSYLATED HEMOGLOBIN (HGB A1C): HbA1c, POC (controlled diabetic range): 14.8 % — AB (ref 0.0–7.0)

## 2025-01-01 MED ORDER — PEN NEEDLES 31G X 5 MM MISC: 3 refills | Status: DC

## 2025-01-01 MED ORDER — FREESTYLE LIBRE 3 PLUS SENSOR MISC: 1 refills | Status: DC

## 2025-01-01 MED ORDER — LANTUS SOLOSTAR 100 UNIT/ML ~~LOC~~ SOPN: 25.0000 [IU] | PEN_INJECTOR | Freq: Every day | SUBCUTANEOUS | 99 refills | Status: DC

## 2025-01-01 MED ORDER — ATORVASTATIN CALCIUM 10 MG PO TABS: 10.0000 mg | ORAL_TABLET | Freq: Every day | ORAL | 1 refills | Status: DC

## 2025-01-01 MED ORDER — FLUCONAZOLE 150 MG PO TABS: ORAL_TABLET | ORAL | 0 refills | Status: DC

## 2025-01-01 MED ORDER — LISINOPRIL 10 MG PO TABS: 10.0000 mg | ORAL_TABLET | Freq: Every day | ORAL | 1 refills | Status: DC

## 2025-01-01 MED ORDER — METFORMIN HCL 1000 MG PO TABS: 1000.0000 mg | ORAL_TABLET | Freq: Two times a day (BID) | ORAL | 1 refills | Status: DC

## 2025-01-01 NOTE — Progress Notes (Signed)
 "  New Patient Office Visit  Subjective    Patient ID: Kenneth Stanton, male    DOB: [birth year]  Age: 49 y.o. MRN: [id number]  CC:  Chief Complaint  Patient presents with   New Patient (Initial Visit)    Pt states he is diabetic and would like his A1c checked. Also reports genital irritations     HPI Kenneth Stanton presents to establish care. Patient had been diagnosed das a diabetic years ago . [place]24. He has not had meds since 12/24. Patient reports that he is having sx of poluria and polydipsia as well as irritation of his penis.     Outpatient Encounter Medications as of 01/01/2025  Medication Sig   atorvastatin  (LIPITOR) 10 MG tablet Take 1 tablet (10 mg total) by mouth daily.   Continuous Glucose Sensor (FREESTYLE LIBRE 3 PLUS SENSOR) MISC Change sensor every 15 days.   fluconazole  (DIFLUCAN ) 150 MG tablet 1 po day 1, 4, 7   insulin glargine  (LANTUS  SOLOSTAR) 100 UNIT/ML Solostar Pen Inject 25 Units into the skin at bedtime.   Insulin Pen Needle (PEN NEEDLES) 31G X 5 MM MISC Dispense based on patient and insurance preference. Use up to four times daily as directed. (FOR ICD-10 E10.9, E11.9).   lisinopril  (ZESTRIL ) 10 MG tablet Take 1 tablet (10 mg total) by mouth daily.   metFORMIN  (GLUCOPHAGE ) 1000 MG tablet Take 1 tablet (1,000 mg total) by mouth 2 (two) times daily with a meal.   benzonatate  (TESSALON ) 100 MG capsule Take 1 capsule (100 mg total) by mouth every 8 (eight) hours.   cyclobenzaprine  (FLEXERIL ) 10 MG tablet Take 1 tablet (10 mg total) by mouth 2 (two) times daily as needed for muscle spasms.   naproxen  (NAPROSYN ) 500 MG tablet Take 1 tablet (500 mg total) by mouth 2 (two) times daily.   No facility-administered encounter medications on file as of 01/01/2025.    History reviewed. No pertinent past medical history.  Past Surgical History:  Procedure Laterality Date   FINGER SURGERY      History reviewed. No pertinent family  history.  Social History   Socioeconomic History   Marital status: Married    Spouse name: Not on file   Number of children: Not on file   Years of education: Not on file   Highest education level: Not on file  Occupational History   Not on file  Tobacco Use   Smoking status: Never   Smokeless tobacco: Never  Substance and Sexual Activity   Alcohol use: Yes   Drug use: No   Sexual activity: Not on file  Other Topics Concern   Not on file  Social History Narrative   Not on file   Social Drivers of Health   Tobacco Use: Low Risk (01/01/2025)   Patient History    Smoking Tobacco Use: Never    Smokeless Tobacco Use: Never    Passive Exposure: Not on file  Financial Resource Strain: Low Risk (02/23/2023)   Received from [facility]   Overall Financial Resource Strain (CARDIA)    Difficulty of Paying Living Expenses: Not very hard  Food Insecurity: No Food Insecurity (02/23/2023)   Received from [facility]   Epic    Within the past 12 months, you worried that your food would run out before you got the money to buy more.: Never true    Within the past 12 months, the food you bought just didn't last  and you didn't have money to get more.: Never true  Transportation Needs: No Transportation Needs (02/23/2023)   Received from [facility]   PRAPARE - Transportation    Lack of Transportation (Medical): No    Lack of Transportation (Non-Medical): No  Physical Activity: Unknown (02/23/2023)   Received from [facility]   Exercise Vital Sign    On average, how many days per week do you engage in moderate to strenuous exercise (like a brisk walk)?: 0 days    Minutes of Exercise per Session: Not on file  Stress: No Stress Concern Present (02/23/2023)   Received from [facility] of Occupational Health - Occupational Stress Questionnaire    Feeling of Stress : Not at all  Social Connections: Socially Integrated (02/23/2023)   Received from [facility]   Social  Network    How would you rate your social network (family, work, friends)?: Good participation with social networks  Intimate Partner Violence: Not At Risk (02/23/2023)   Received from [facility]   HITS    Over the last 12 months how often did your partner physically hurt you?: Never    Over the last 12 months how often did your partner insult you or talk down to you?: Never    Over the last 12 months how often did your partner threaten you with physical harm?: Never    Over the last 12 months how often did your partner scream or curse at you?: Rarely  Depression (PHQ2-9): Not on file  Alcohol Screen: Low Risk (01/01/2025)   Alcohol Screen    Last Alcohol Screening Score (AUDIT): 2  Housing: Not on file  Utilities: Not At Risk (02/23/2023)   Received from [facility] Utilities    Threatened with loss of utilities: No  Health Literacy: Not on file    Review of Systems  All other systems reviewed and are negative.       Objective   BP 134/77   Pulse 83   Ht 6' 1 (1.854 m)   Wt 190 lb 9.6 oz (86.5 kg)   SpO2 96%   BMI 25.15 kg/m   Physical Exam Vitals and nursing note reviewed.  Constitutional:      General: He is not in acute distress. Cardiovascular:     Rate and Rhythm: Normal rate and regular rhythm.  Pulmonary:     Effort: Pulmonary effort is normal.     Breath sounds: Normal breath sounds.  Abdominal:     Palpations: Abdomen is soft.     Tenderness: There is no abdominal tenderness.  Genitourinary:    Penis: Uncircumcised.      Comments: Erythema of head of penis Neurological:     General: No focal deficit present.     Mental Status: He is alert and oriented to person, place, and time.         Assessment & Plan:  1. Uncontrolled type 2 diabetes mellitus with hypoglycemia without coma (HCC) (Primary) Discussed compliance. Prescribed metformin  and Lantus  25 units at HS. Also freestyle Libre. Labs ordered. Referred to [facility] for med management.  -  POCT glycosylated hemoglobin (Hb A1C) - Basic Metabolic Panel  2. Balanitis Clinically appears to be yeast, diflucan  prescribed.   3. Hyperlipidemia, unspecified hyperlipidemia type Patient prescribed lipitor as per previous pcp  4. Encounter to establish care   Return in about 6 weeks (around 02/12/2025) for follow up.   Tanda Raguel SQUIBB, MD  "

## 2025-01-02 LAB — BASIC METABOLIC PANEL WITH GFR
BUN/Creatinine Ratio: 12 (ref 9–20)
BUN: 12 mg/dL (ref 6–24)
CO2: 20 mmol/L (ref 20–29)
Calcium: 9.4 mg/dL (ref 8.7–10.2)
Chloride: 99 mmol/L (ref 96–106)
Creatinine, Ser: 1.02 mg/dL (ref 0.76–1.27)
Glucose: 309 mg/dL — ABNORMAL HIGH (ref 70–99)
Potassium: 4.6 mmol/L (ref 3.5–5.2)
Sodium: 140 mmol/L (ref 134–144)
eGFR: 90 mL/min/1.73

## 2025-01-04 ENCOUNTER — Ambulatory Visit: Payer: Self-pay | Admitting: Family Medicine

## 2025-01-05 ENCOUNTER — Other Ambulatory Visit: Payer: Self-pay | Admitting: Family Medicine

## 2025-01-06 NOTE — Telephone Encounter (Signed)
 Requested medication (s) are due for refill today: alternative requested  Requested medication (s) are on the active medication list: yes  Last refill:  01/01/25  Future visit scheduled: yes  Notes to clinic:    Pharmacy comment: Alternative Requested:HAS TO FILL AT [facility].    Endocrinology:  Diabetes - Insulins Failed01/13/2026 11:59 AM         Requested Prescriptions  Pending Prescriptions Disp Refills   LANTUS  SOLOSTAR 100 UNIT/ML Solostar Pen [Pharmacy Med Name: LANTUS  SOLOSTAR 100 UNIT/ML]  PRN    Sig: Inject 25 Units into the skin at bedtime.     Endocrinology:  Diabetes - Insulins Failed - 01/06/2025  1:34 PM      Failed - HBA1C is between 0 and 7.9 and within 180 days    HbA1c, POC (controlled diabetic range)  Date Value Ref Range Status  01/01/2025 14.8 (A) 0.0 - 7.0 % Final         Passed - Valid encounter within last 6 months    Recent Outpatient Visits           5 days ago Uncontrolled type 2 diabetes mellitus with hypoglycemia without coma [facility])   [facility], MD

## 2025-01-07 NOTE — Telephone Encounter (Signed)
 Refill request refused 5 pen sent to [id number] [address], [place] - [address]  [address], [place] [address]  01/02/2024

## 2025-01-22 ENCOUNTER — Encounter: Payer: Self-pay | Admitting: Pharmacist

## 2025-01-22 ENCOUNTER — Ambulatory Visit: Payer: Self-pay | Attending: Family Medicine | Admitting: Pharmacist

## 2025-01-22 ENCOUNTER — Telehealth: Payer: Self-pay | Admitting: Pharmacist

## 2025-01-22 ENCOUNTER — Telehealth: Payer: Self-pay | Admitting: Family Medicine

## 2025-01-22 DIAGNOSIS — E11649 Type 2 diabetes mellitus with hypoglycemia without coma: Secondary | ICD-10-CM

## 2025-01-22 DIAGNOSIS — Z7984 Long term (current) use of oral hypoglycemic drugs: Secondary | ICD-10-CM

## 2025-01-22 LAB — POCT GLYCOSYLATED HEMOGLOBIN (HGB A1C): HbA1c, POC (controlled diabetic range): 13.3 % — AB (ref 0.0–7.0)

## 2025-01-22 MED ORDER — ONETOUCH VERIO VI STRP: ORAL_STRIP | 6 refills | Status: AC

## 2025-01-22 MED ORDER — ONETOUCH DELICA PLUS LANCET33G MISC: 6 refills | Status: AC

## 2025-01-22 MED ORDER — LISINOPRIL 10 MG PO TABS: 10.0000 mg | ORAL_TABLET | Freq: Every day | ORAL | 1 refills | Status: AC

## 2025-01-22 MED ORDER — LANTUS SOLOSTAR 100 UNIT/ML ~~LOC~~ SOPN: 25.0000 [IU] | PEN_INJECTOR | Freq: Every day | SUBCUTANEOUS | 99 refills | Status: DC

## 2025-01-22 MED ORDER — METFORMIN HCL 1000 MG PO TABS: 1000.0000 mg | ORAL_TABLET | Freq: Two times a day (BID) | ORAL | 1 refills | Status: AC

## 2025-01-22 MED ORDER — ATORVASTATIN CALCIUM 10 MG PO TABS: 10.0000 mg | ORAL_TABLET | Freq: Every day | ORAL | 1 refills | Status: AC

## 2025-01-22 MED ORDER — ONETOUCH VERIO FLEX SYSTEM W/DEVICE KIT: PACK | 0 refills | Status: AC

## 2025-01-22 MED ORDER — PEN NEEDLES 31G X 5 MM MISC: 3 refills | Status: AC

## 2025-01-22 NOTE — Telephone Encounter (Signed)
 Copied from CRM [id number]. Topic: Clinical - Prescription Issue >> Jan 22, 2025  5:46 PM Delon DASEN wrote: Reason for CRM: insulin glargine  (LANTUS  SOLOSTAR) 100 UNIT/ML Solostar Pen- not covered by insurance without PA

## 2025-01-22 NOTE — Progress Notes (Signed)
 "   S:     No chief complaint on file.  49 y.o. male who presents for diabetes evaluation, education, and management in the context of the LIBERATE study. Patient arrives in good spirits and presents without any assistance.   Patient was referred and last seen by Primary Care Provider, Dr. Tanda, on 01/01/2025. A1c at that visit was 14.8%. BMP showed normal renal function, normal electrolyte status. Glucose was 309 mg/dL. Patient was started on Lantus  and metformin  for DM. He was started on lisinopril  for BP. Lastly, he was started on atorvastatin  for cholesterol.   PMH is significant for type 2 DM, HTN, hyperlipidemia. Patient reports diabetes is longstanding. At his visit with Dr. Tanda, he admitted that he had not taken meds prior to that appt for ~1 year. He endorsed symptomatic hyperglycemia and was started on insulin. He has no known hx of clinical ASCVD, CHF, or CKD. No known hx of pancreatitis or thyroid  cancer.   Today, he is doing well. A1c is 13.3%. He is interested in [place]. He is taking the metformin  BID but has been unable to get insulin due to insurance issues. His polyuria and polydipsia has improved since starting metformin . He does not have any supplies to check sugar at home currently.   Family/Social History:  Fhx: no pertinent positives listed  Tobacco: never smoker Alcohol: none reported   Current diabetes medications include: Lantus  25 units daily (not taking), metformin  1000 mg BID Current hypertension medications include: lisinopril  10 mg daily Current hyperlipidemia medications include: atorvastatin  10 mg daily   Patient reports adherence to taking all medications as prescribed.   Insurance coverage: BCBS  Patient denies hypoglycemic events.  Reported home fasting glucose: none  Reported 2 hour post-meal/random glucose: none.  Patient denies nocturia (nighttime urination).  Patient denies neuropathy (nerve pain). Patient denies visual changes. Patient  reports self foot exams.   O:  Lab Results  Component Value Date   HGBA1C 14.8 (A) 01/01/2025   There were no vitals filed for this visit.  Lipid Panel  No results found for: CHOL, TRIG, HDL, CHOLHDL, VLDL, LDLCALC, LDLDIRECT  Clinical Atherosclerotic Cardiovascular Disease (ASCVD): No  The 10-year ASCVD risk score (Arnett DK, et al., 2019) is: 10.4%   Values used to calculate the score:     Age: 58 years     Clinically relevant sex: Male     Is Non-Hispanic African American: Yes     Diabetic: Yes     Tobacco smoker: No     Systolic Blood Pressure: 134 mmHg     Is BP treated: No     HDL Cholesterol: 48 mg/dL     Total Cholesterol: 177 mg/dL  No results found for: CHOL, HDL, LDLCALC, LDLDIRECT, TRIG, CHOLHDL  Lab Results  Component Value Date   CREATININE 1.02 01/01/2025   BUN 12 01/01/2025   NA 140 01/01/2025   K 4.6 01/01/2025   CL 99 01/01/2025   CO2 20 01/01/2025    Medications Reviewed Today     Reviewed by Fleeta Tonia Garnette LITTIE, RPH-CPP (Pharmacist) on 01/22/25 at 1651  Med List Status: <None>   Medication Order Taking? Sig Documenting Provider Last Dose Status Informant  atorvastatin  (LIPITOR) 10 MG tablet [id number]  Take 1 tablet (10 mg total) by mouth daily. Newlin, Enobong, MD  Active   Blood Glucose Monitoring Suppl (ONETOUCH VERIO FLEX SYSTEM) w/Device KIT [id number] Yes Use to check blood sugars three times daily. Newlin, Enobong, MD  Active  Continuous Glucose Sensor (FREESTYLE LIBRE 3 PLUS SENSOR) MISC [id number]  USE 1 SENSOR EVERY 14 DAYS Tanda Bleacher, MD  Active   glucose blood [facility] VERIO) test strip [id number] Yes Use to check blood sugar three times daily. Newlin, Enobong, MD  Active   insulin glargine  (LANTUS  SOLOSTAR) 100 UNIT/ML Solostar Pen [id number]  Inject 25 Units into the skin at bedtime. Newlin, Enobong, MD  Active   Insulin Pen Needle (PEN NEEDLES) 31G X 5 MM MISC [id number]  Dispense based on patient and  insurance preference. Use up to four times daily as directed. (FOR ICD-10 E10.9, E11.9). Newlin, Enobong, MD  Active   Lancets [facility] DELICA PLUS [place]) MISC [id number] Yes Use to check blood sugar three times daily. Newlin, Enobong, MD  Active   lisinopril  (ZESTRIL ) 10 MG tablet [id number]  Take 1 tablet (10 mg total) by mouth daily. Newlin, Enobong, MD  Active   metFORMIN  (GLUCOPHAGE ) 1000 MG tablet [id number]  Take 1 tablet (1,000 mg total) by mouth 2 (two) times daily with a meal. Delbert Clam, MD  Active              Patient is participating in a Managed Medicaid Plan: No   A/P: LIBERATE Study:  -Patient provided verbal consent to participate in the study. Consent documented in electronic medical record.  -Provided education on Libre 3 CGM. Collaborated to ensure [place] 3 app was downloaded on patient's phone. Educated on how to place sensor every 14 days, patient placed first sensor correctly and verbalized understanding of use, removal, and how to place next sensor. Discussed alarms.  -4 sensors provided for a 2 month supply. Educated to contact the office if the sensor falls off early and replacements are needed before their next [employer].    Diabetes longstanding currently uncontrolled with A1c of 13.3%. However, this is down already from 14.8% earlier this month on metformin  alone. We will have him start Lantus  today. His insurance covers medications filled at [facility]. I have resent all medications, including Lantus , to [facility]. Patient's symptoms of hyperglycemia are improving. He is not currently hypoglycemic but is able to verbalize appropriate hypoglycemia management plan.  -Started Lantus  25 units. Rxn sent to [facility] pharmacy.  -Continued metformin  1000 mg BID. -Started Libre 3 CGM. OneTouch Verio supplies sent for back-up testing.  -Patient educated on purpose, proper use, and potential adverse effects of CGM and Lantus  insulin.  -Extensively discussed  pathophysiology of diabetes, recommended lifestyle interventions, dietary effects on blood sugar control.  -Counseled on s/sx of and management of hypoglycemia.  -Next A1c anticipated 03/2025.   Written patient instructions provided. Patient verbalized understanding of treatment plan.  Total time in face to face counseling 40 minutes.    Follow-up:  Pharmacist in 4 weeks.  Herlene Fleeta Morris, PharmD, JAQUELINE, CPP Clinical Pharmacist [facility] [phone]   "

## 2025-01-22 NOTE — Telephone Encounter (Signed)
 LIBERATE Study  Received referral for patient participation in the LIBERATE CGM Study. Contacted patient to discuss study and confirmed HIPAA identifiers. Confirmed patient was provided the LIBERATE Study Information Sheet and any questions were answered.   Confirmed that patient meets study criteria by having a diagnosis of Type 2 Diabetes, is not currently on insulin, and most recent A1c is >8%.  Patient provided verbal consent to participate in the study. Consent documented in electronic medical record.   - Confirmed that patient has a compatible smart phone to download [place] 3 app.  - Asked to download [place] 3 app and create a Herlene account prior to first study visit.  - Scheduled first study visit for today. Confirmed patient has transportation to this appointment.   Herlene Fleeta Morris, PharmD, JAQUELINE, CPP Clinical Pharmacist [facility] [phone]

## 2025-01-25 ENCOUNTER — Other Ambulatory Visit: Payer: Self-pay

## 2025-01-25 MED ORDER — TRESIBA FLEXTOUCH 100 UNIT/ML ~~LOC~~ SOPN: 25.0000 [IU] | PEN_INJECTOR | Freq: Every day | SUBCUTANEOUS | 1 refills | Status: AC

## 2025-01-27 ENCOUNTER — Encounter: Payer: Self-pay | Admitting: Pharmacist

## 2025-02-16 ENCOUNTER — Ambulatory Visit: Payer: Self-pay | Admitting: Family Medicine

## 2025-02-26 ENCOUNTER — Ambulatory Visit: Payer: Self-pay | Admitting: Pharmacist
